# Patient Record
Sex: Female | Born: 1995 | Race: White | Hispanic: No | Marital: Single | Smoking: Current every day smoker
Health system: Southern US, Community
[De-identification: ages and names within clinical notes are randomized; demographics above are authoritative.]

## PROBLEM LIST (undated history)

## (undated) DIAGNOSIS — B1920 Unspecified viral hepatitis C without hepatic coma: Secondary | ICD-10-CM

---
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
## 2016-02-09 IMAGING — US US RENAL
1 series · 14 of 25 positions shown · non-contrast
Comparison: None.

CLINICAL DATA: Acute onset of right flank pain.  Initial encounter.

EXAM:
RENAL/URINARY TRACT ULTRASOUND COMPLETE

[Series 1: us renal · 0.20mm/px · 14 of 25 slices shown]
[im 1/25]
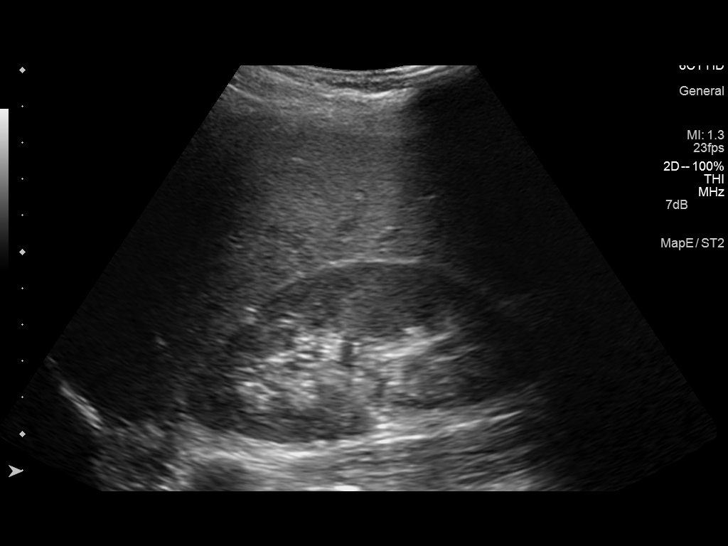
[im 3/25]
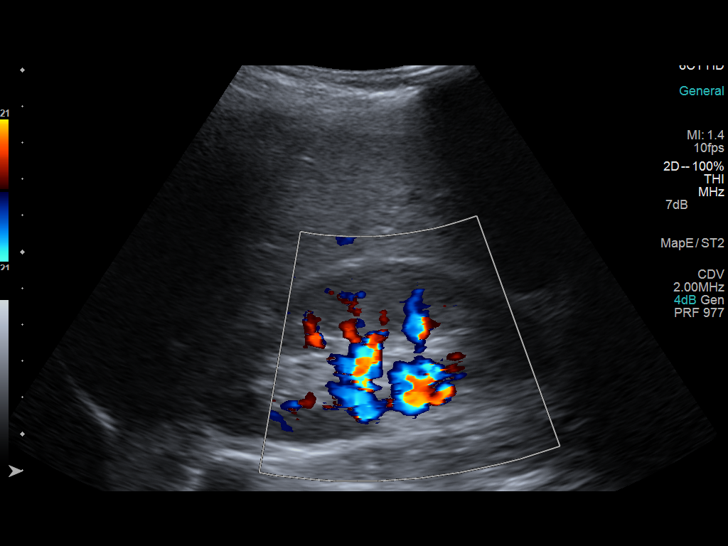
[im 5/25]
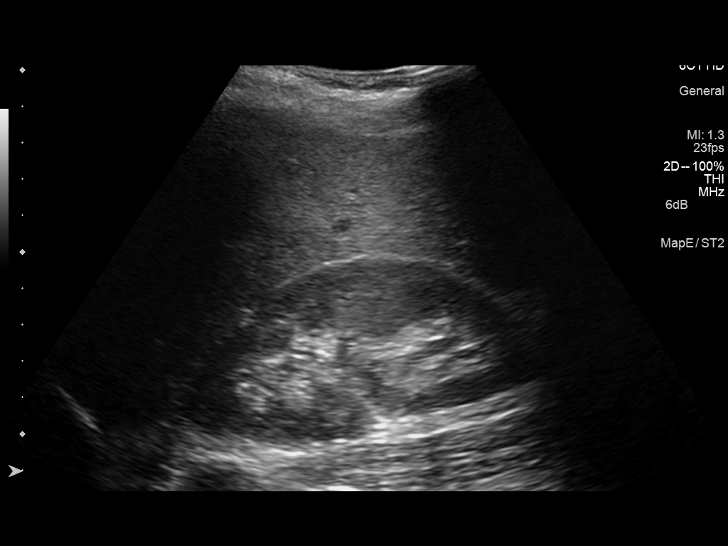
[im 7/25]
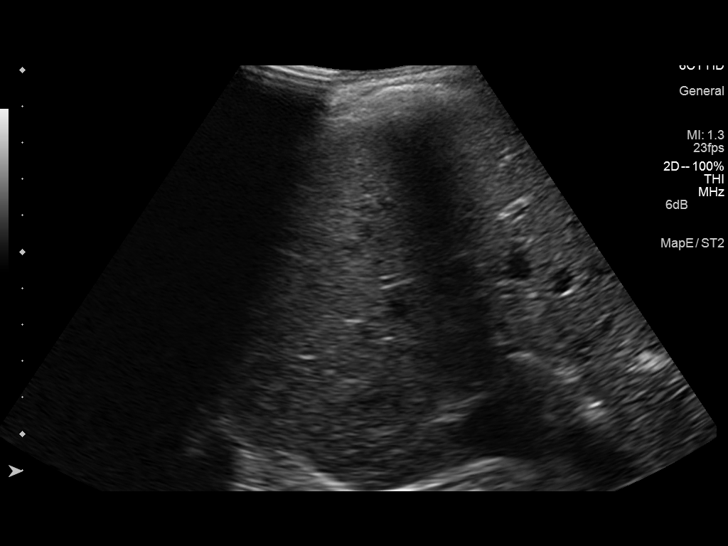
[im 9/25]
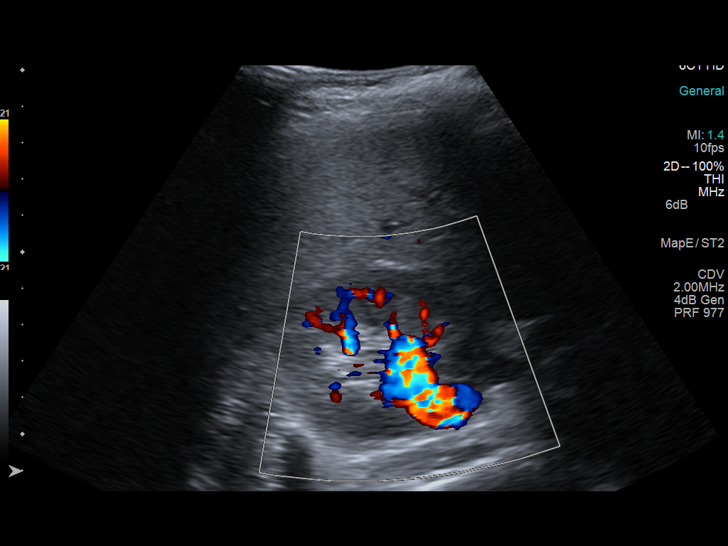
[im 10/25]
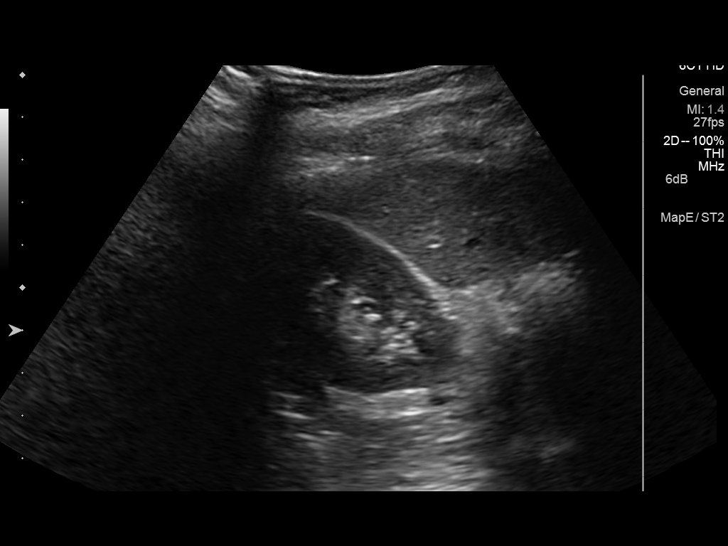
[im 12/25]
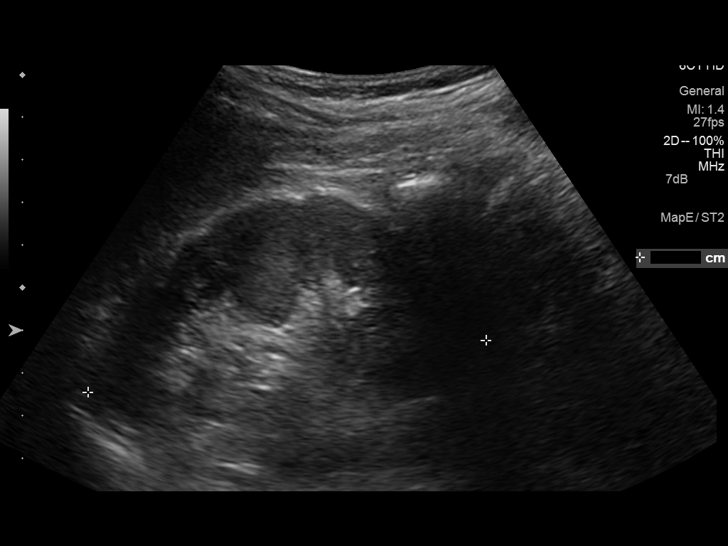
[im 14/25]
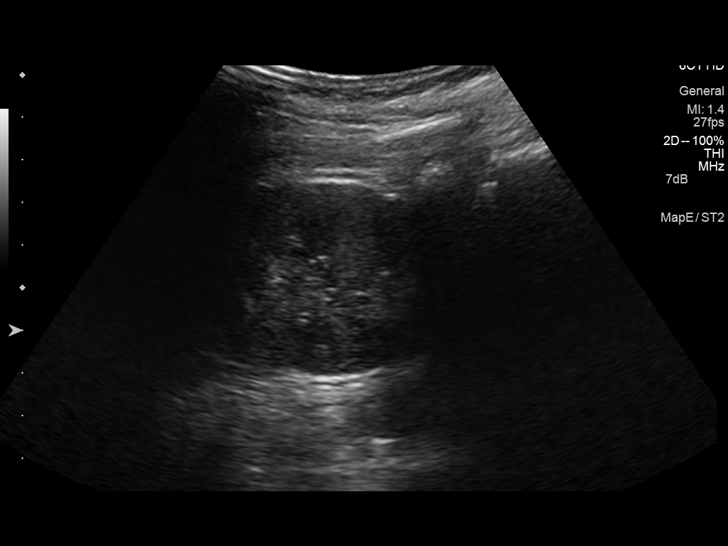
[im 16/25]
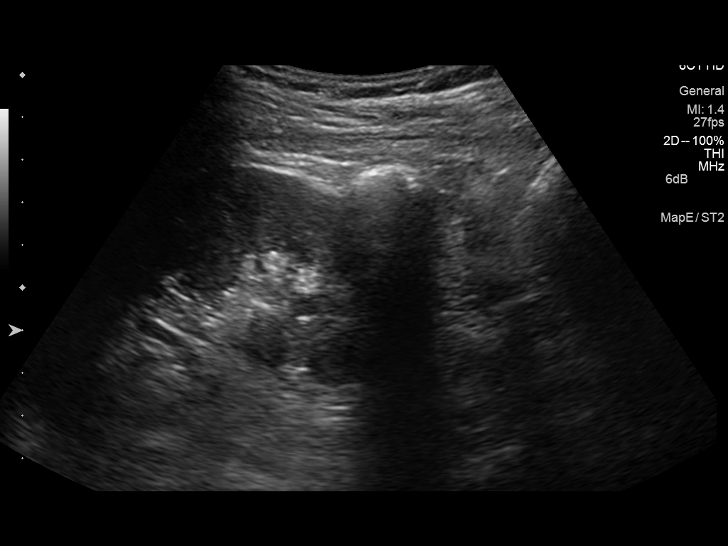
[im 17/25]
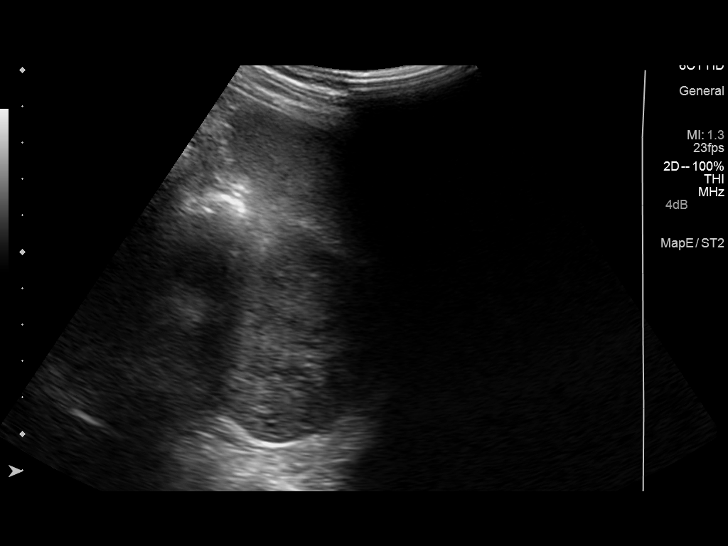
[im 19/25]
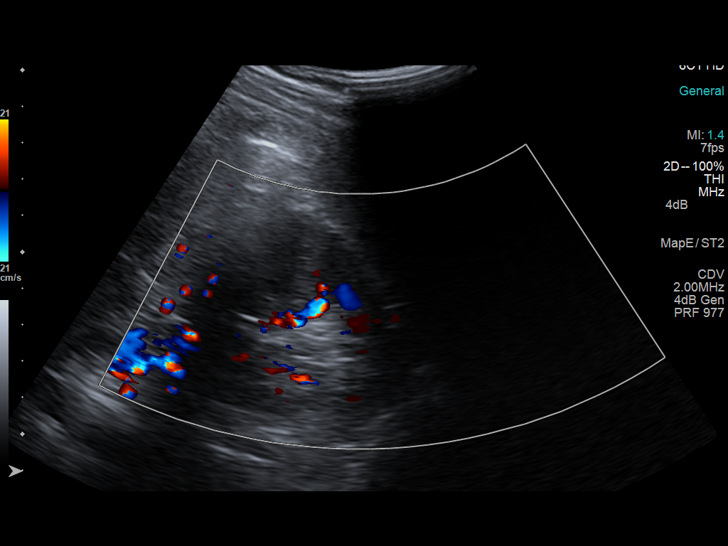
[im 21/25]
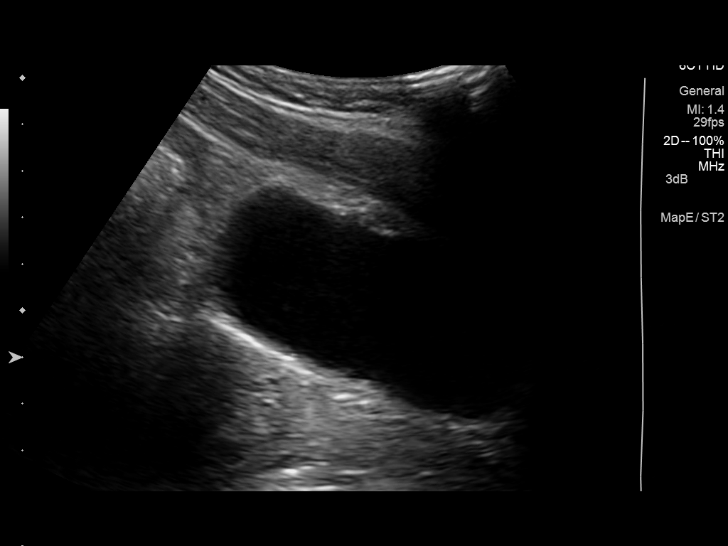
[im 23/25]
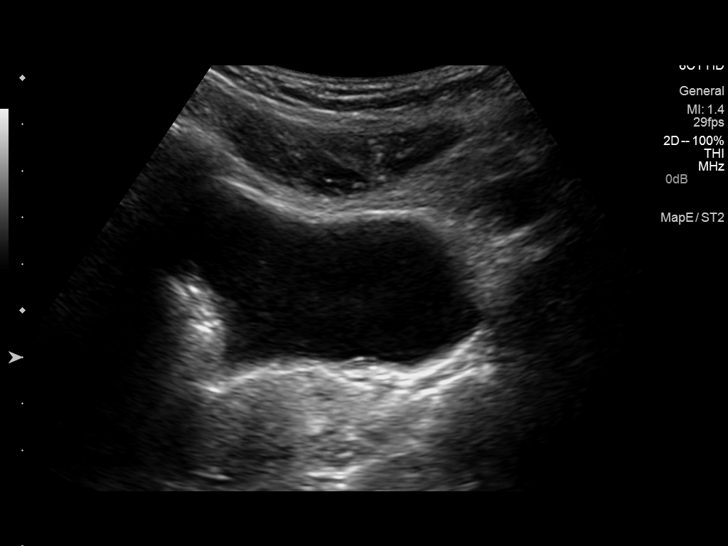
[im 25/25]
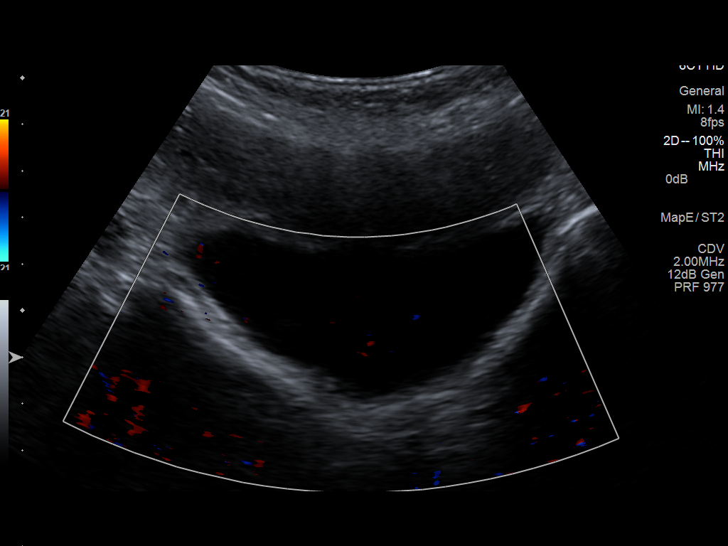

[14 of 25 positions shown; findings below may reference images not displayed]

FINDINGS: Right Kidney:

Length: 9.8 cm. Echogenicity within normal limits. No mass or
hydronephrosis visualized.

Left Kidney:

Length: 9.4 cm. Echogenicity within normal limits. No mass or
hydronephrosis visualized.

Bladder:

Appears normal for degree of bladder distention. Bilateral ureteral
jets are not visualized, but there is no definite evidence for
obstruction.
IMPRESSION: Unremarkable renal ultrasound.

## 2019-05-12 DEATH — deceased
